# Patient Record
Sex: Female | Born: 2005 | Race: Black or African American | Hispanic: No | Marital: Single | Smoking: Never smoker
Health system: Southern US, Community
[De-identification: ages and names within clinical notes are randomized; demographics above are authoritative.]

## PROBLEM LIST (undated history)

## (undated) DIAGNOSIS — D1800 Hemangioma unspecified site: Secondary | ICD-10-CM

## (undated) DIAGNOSIS — L309 Dermatitis, unspecified: Secondary | ICD-10-CM

## (undated) DIAGNOSIS — F909 Attention-deficit hyperactivity disorder, unspecified type: Secondary | ICD-10-CM

## (undated) DIAGNOSIS — K589 Irritable bowel syndrome without diarrhea: Secondary | ICD-10-CM

## (undated) DIAGNOSIS — J302 Other seasonal allergic rhinitis: Secondary | ICD-10-CM

## (undated) HISTORY — DX: Hemangioma unspecified site: D18.00

## (undated) HISTORY — PX: TONSILLECTOMY: SUR1361

## (undated) HISTORY — PX: ADENOIDECTOMY: SHX5191

---
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
## 2024-02-01 ENCOUNTER — Emergency Department (HOSPITAL_COMMUNITY)
Admission: EM | Admit: 2024-02-01 | Discharge: 2024-02-01 | Disposition: A | Discharge status: Home / Self Care | Attending: Emergency Medicine | Admitting: Emergency Medicine

## 2024-02-01 ENCOUNTER — Encounter (HOSPITAL_COMMUNITY): Payer: Self-pay

## 2024-02-01 ENCOUNTER — Other Ambulatory Visit: Payer: Self-pay

## 2024-02-01 DIAGNOSIS — R109 Unspecified abdominal pain: Secondary | ICD-10-CM | POA: Diagnosis present

## 2024-02-01 DIAGNOSIS — R197 Diarrhea, unspecified: Secondary | ICD-10-CM | POA: Insufficient documentation

## 2024-02-01 DIAGNOSIS — R1084 Generalized abdominal pain: Secondary | ICD-10-CM | POA: Diagnosis not present

## 2024-02-01 LAB — LIPASE, BLOOD: Lipase: 32 U/L (ref 11–51)

## 2024-02-01 LAB — COMPREHENSIVE METABOLIC PANEL WITH GFR
ALT: 12 U/L (ref 0–44)
AST: 15 U/L (ref 15–41)
Albumin: 3.9 g/dL (ref 3.5–5.0)
Alkaline Phosphatase: 78 U/L (ref 38–126)
Anion gap: 12 (ref 5–15)
BUN: 10 mg/dL (ref 6–20)
CO2: 21 mmol/L — ABNORMAL LOW (ref 22–32)
Calcium: 9 mg/dL (ref 8.9–10.3)
Chloride: 106 mmol/L (ref 98–111)
Creatinine, Ser: 0.7 mg/dL (ref 0.44–1.00)
GFR, Estimated: >60 mL/min (ref >60)
Glucose, Bld: 99 mg/dL (ref 70–99)
Potassium: 3.7 mmol/L (ref 3.5–5.1)
Sodium: 139 mmol/L (ref 135–145)
Total Bilirubin: 0.6 mg/dL (ref 0.0–1.2)
Total Protein: 7.7 g/dL (ref 6.5–8.1)

## 2024-02-01 LAB — CBC
HCT: 41.8 % (ref 36.0–46.0)
Hemoglobin: 13.5 g/dL (ref 12.0–15.0)
MCH: 27.8 pg (ref 26.0–34.0)
MCHC: 32.3 g/dL (ref 30.0–36.0)
MCV: 86.2 fL (ref 80.0–100.0)
Platelets: 457 10*3/uL — ABNORMAL HIGH (ref 150–400)
RBC: 4.85 MIL/uL (ref 3.87–5.11)
RDW: 12.7 % (ref 11.5–15.5)
WBC: 4.9 10*3/uL (ref 4.0–10.5)
nRBC: 0 % (ref 0.0–0.2)

## 2024-02-01 LAB — HCG, SERUM, QUALITATIVE: Preg, Serum: NEGATIVE

## 2024-02-01 MED ORDER — ONDANSETRON 4 MG PO TBDP: 4.0000 mg | ORAL_TABLET | Freq: Three times a day (TID) | ORAL | 0 refills | Status: AC | PRN

## 2024-02-01 NOTE — ED Notes (Addendum)
 Pt got urine sample but dropped it in the sharps container. Pt informed she will have to another when she can

## 2024-02-01 NOTE — Discharge Instructions (Addendum)
 You were seen today for abdominal pain accompanied with diarrhea after eating Popeyes.  Suspect this might be either a viral gastroenteritis or a IBS flareup.  Would recommend that you continue to avoid fatty/spicy foods as well as eating more gentle fluids on the stomach like the brat diet (bread, rice, applesauce, toast), as well as taking probiotics to help with stomach discomfort.  Continue to take Tylenol  for abdominal discomfort as well as nausea medications which I am prescribing for you to take as needed.  Please return to the ED if you begin to have uncontrollable pain, fever, blood in stool or urine, shortness of breath, vomiting, as imaging will be needed at that time.  You have any persistent symptoms that continue, recommend that you follow-up with PCP for further evaluation at that time.

## 2024-02-01 NOTE — ED Triage Notes (Signed)
 LLQ pain that radiates into left side for 3 days, worse after eating. Nausea, no vomiting, diarrhea.

## 2024-02-01 NOTE — ED Provider Notes (Signed)
 [place] EMERGENCY DEPARTMENT AT [facility] Provider Note   CSN: [id number] Arrival date & time: 02/01/24  1944     Patient presents with: Abdominal Pain   Valerie Alvarez is a 18 y.o. female.   Abdominal Pain Associated symptoms: diarrhea and nausea   Patient is an 18 year old female presenting to the ED with her mother today for concerns for abdominal pain with watery diarrhea after eating a Popeyes spicy sandwich 2 days ago.  Says that since then she has had nausea as well as persistent diarrhea.  States that her abdominal pain has been coming in waves and hurts particularly around bowel movements.  LMP started 2 days ago and is currently on her period, normal flow.  Medical history of abdominal pain, IBS, dermatitis, hemangioma, ADHD  Mother states that she had previous symptoms 2 years ago that was worked up extensively with ultrasound, CT, MRIs with no acute findings.  Denies fever, headache, vision changes, chest pain, shortness of breath, vomiting, dysuria, hematuria, hematochezia, melena, vaginal discharge, vaginal itching, lower leg swelling.     Prior to Admission medications   Medication Sig Start Date End Date Taking? Authorizing Provider  acetaminophen  (TYLENOL ) 325 MG tablet Take 2 tablets (650 mg total) by mouth every 6 (six) hours as needed for mild pain. 01/20/21   Gold, Caitlyn, MD  acetaminophen  (TYLENOL ) 500 MG tablet Take 500 mg by mouth every 6 (six) hours as needed.    [provider]  adapalene (DIFFERIN) 0.1 % cream Apply 1 Application topically at bedtime. 06/06/20   [provider]  busPIRone (BUSPAR) 10 MG tablet Take 10 mg by mouth 2 (two) times daily. 05/13/23   [provider]  busPIRone (BUSPAR) 15 MG tablet Take 15 mg by mouth 2 (two) times daily. 09/10/23   [provider]  cetirizine  (ZYRTEC ) 10 MG tablet Take 10 mg by mouth daily. 11/13/20   [provider]  Clindamycin-Benzoyl Per,  Refr, gel Apply 1 Application topically 2 (two) times daily. 06/06/20   [provider]  cyclobenzaprine  (FLEXERIL ) 10 MG tablet Take 1 tablet (10 mg) approximately 4 hours before your scheduled appointment. Patient not taking: Reported on 06/15/2023 05/03/23   Joshua Bari HERO, NP  dexmethylphenidate (FOCALIN) 5 MG tablet Take 5 mg by mouth daily as needed (only during the school year). 11/13/20   [provider]  famotidine  (PEPCID ) 20 MG tablet Take 1 tablet (20 mg total) by mouth 2 (two) times daily. 01/20/21 02/19/21  Gold, Caitlyn, MD  fluocinonide (LIDEX) 0.05 % external solution Apply 1 Application topically as needed. 01/20/13   [provider]  fluticasone  (FLONASE ) 50 MCG/ACT nasal spray Place 2 sprays into both nostrils daily.    [provider]  ketoconazole (NIZORAL) 2 % shampoo Apply 1 Application topically daily at 2 PM. 05/30/14   [provider]  loratadine (CLARITIN) 5 MG/5ML syrup Take by mouth daily. 05/30/14   [provider]  Olopatadine  HCl (PATADAY  OP) Place 2 drops into both eyes daily.    [provider]  Olopatadine  HCl 0.6 % SOLN Place 2 each into the nose daily.    [provider]  ondansetron  (ZOFRAN ) 4 MG tablet Take 1 tablet (4 mg total) by mouth every 8 (eight) hours as needed for nausea or vomiting. 01/20/21   Gold, Caitlyn, MD  ondansetron  (ZOFRAN -ODT) 4 MG disintegrating tablet Take 1 tablet (4 mg total) by mouth every 8 (eight) hours as needed. 02/01/24   Zada Haser  S, PA-C  pantoprazole (PROTONIX) 40 MG tablet Take 40 mg by mouth daily. 01/27/21   [provider]  Phenylephrine-Pheniramine-DM [facility] COLD & COUGH PO) Take by mouth.    [provider]  polyethylene glycol (MIRALAX  / GLYCOLAX ) 17 g packet Take 17 g by mouth as needed (constipation).    [provider]  QUILLICHEW  ER 40 MG CHER chewable tablet Take 40 mg by mouth every morning. 08/26/18   [provider]  spironolactone  (ALDACTONE ) 50 MG tablet Take 1 tablet (50 mg total) by mouth daily. 06/15/23 09/13/23  Joshua Bari HERO, NP  topiramate  (TOPAMAX ) 25 MG tablet Take 1 tablet (25 mg total) by mouth 2 (two) times daily. 12/19/19   Corinthia Blossom, MD  Vitamin D , Ergocalciferol , (DRISDOL ) 1.25 MG (50000 UNIT) CAPS capsule Take 1 capsule (50,000 Units total) by mouth every 7 (seven) days. 05/05/23   Joshua Bari HERO, NP  amitriptyline  (ELAVIL ) 25 MG tablet Take 1.5 tablets (37.5 mg total) by mouth at bedtime. 08/14/19 01/17/21  Corinthia Blossom, MD    Allergies: Lactose intolerance (gi), Milk (cow), Wheat, and Other    Review of Systems  Gastrointestinal:  Positive for abdominal pain, diarrhea and nausea.  All other systems reviewed and are negative.   Updated Vital Signs BP 114/82 (BP Location: Right Arm)   Pulse 79   Temp 98.8 F (37.1 C) (Oral)   Resp 19   Ht 5' 4 (1.626 m)   Wt 74.4 kg   SpO2 98%   BMI 28.15 kg/m   Physical Exam Vitals and nursing note reviewed.  Constitutional:      General: She is not in acute distress.    Appearance: Normal appearance. She is not ill-appearing or diaphoretic.  HENT:     Head: Normocephalic and atraumatic.     Mouth/Throat:     Mouth: Mucous membranes are moist.     Pharynx: Oropharynx is clear. No pharyngeal swelling or oropharyngeal exudate.  Eyes:     General: No scleral icterus.    Extraocular Movements: Extraocular movements intact.     Conjunctiva/sclera: Conjunctivae normal.     Pupils: Pupils are equal, round, and reactive to light.  Cardiovascular:     Rate and Rhythm: Normal rate and regular rhythm.     Pulses: Normal pulses.     Heart sounds: Normal heart sounds. No murmur heard.    No friction rub. No gallop.  Pulmonary:     Effort: Pulmonary effort is normal. No respiratory distress.     Breath sounds: Normal breath sounds. No stridor. No wheezing, rhonchi or rales.  Abdominal:     General: Abdomen is flat. A  surgical scar is present. Bowel sounds are normal. There is no distension.     Palpations: Abdomen is soft. There is no hepatomegaly.     Tenderness: There is abdominal tenderness in the epigastric area and left lower quadrant. There is no right CVA tenderness, left CVA tenderness, guarding or rebound. Negative signs include Murphy's sign, Rovsing's sign, McBurney's sign, psoas sign and obturator sign.     Hernia: No hernia is present.     Comments: Hemangioma removal scar noted to the left side of her abdomen  Genitourinary:    Comments: Deferred GU exam at this time. Skin:    General: Skin is warm and dry.     Coloration: Skin is not mottled or pale.     Findings: No erythema.  Neurological:     General: No focal deficit present.  Mental Status: She is alert and oriented to person, place, and time. Mental status is at baseline.     Cranial Nerves: No cranial nerve deficit.     Motor: No weakness.  Psychiatric:        Mood and Affect: Mood normal. Mood is not anxious.     (all labs ordered are listed, but only abnormal results are displayed) Labs Reviewed  COMPREHENSIVE METABOLIC PANEL WITH GFR - Abnormal; Notable for the following components:      Result Value   CO2 21 (*)    All other components within normal limits  CBC - Abnormal; Notable for the following components:   Platelets 457 (*)    All other components within normal limits  LIPASE, BLOOD  HCG, SERUM, QUALITATIVE  URINALYSIS, ROUTINE W REFLEX MICROSCOPIC    EKG: None  Radiology: No results found.   Procedures   Medications Ordered in the ED - No data to display                                Medical Decision Making Risk Prescription drug management.   This patient is a 18 year old female with mother who presents to the ED for concern of epigastric pain abdominal pain and left lower quadrant abdominal pain that began suddenly after eating Popeyes 2 days ago.  Notes that she has a previous medical  history of IBS as well as had had similar symptoms 2 years ago that was on the right lower quadrant which was extensively worked up with MRIs, CTs and ultrasounds did not find any acute findings.  On physical exam, patient is in no acute distress, afebrile, alert and orient x 4, speaking in full sentences, nontachypneic, nontachycardic.  Noted to have mild abdominal tenderness to palpation to the epigastric and left lower quadrants.  No rebound tenderness, no acute abdomen findings.  No rashes.  Bowel sounds present in all 4 quadrants.  No CVA tenderness.  Patient deferred GU exam this time.  Oropharynx clear.  No lower leg edema, LCTAB, RRR, no murmur.  Unremarkable exam otherwise.  Lab findings were unremarkable.  Discussed with both patient and mother about possible risks as well as possible imaging for further evaluation of patient symptoms.  However spoke with patient and mother and had shared decision making, with patient requesting to go home and not undergo further imaging at this time.  She believes the pain is manageable and would like to go home and monitor symptoms, believing this is most likely an IBS flareup.  Spoke to her that imaging will be necessary to rule out all of the emergent pathologies regarding her GU versus GI conditions that could present with the symptoms however believe that she could go home and continue to monitor and return if anything worsens.  She also does not wish to provide any urine at this time and desires to go home.  Will send home with Zofran  for some nausea and have her return for any new or worsening symptoms.  Patient vital signs have remained stable throughout the course of patient's time in the ED. Low suspicion for any other emergent pathology at this time. I believe this patient is safe to be discharged. Provided strict return to ER precautions. Patient expressed agreement and understanding of plan. All questions were answered.   Differential diagnoses prior  to evaluation: The emergent differential diagnosis includes, but is not limited to, mesenteric ischemia, diverticulitis, nephrolithiasis,  constipation, bowel obstruction, IBD, ectopic pregnancy, ovarian torsion, PID. This is not an exhaustive differential.   Past Medical History / Co-morbidities / Social History: abdominal pain, IBS, dermatitis, hemangioma, ADHD  Additional history: Chart reviewed. Pertinent results include:   Noted to have CT abdomen pelvis, ultrasound of pelvis and right upper quadrant as well as MRI of abdomen pelvis and MR angiogram of the abdomen, showing no acute findings or specific findings.  Lab Tests/Imaging studies: I personally interpreted labs/imaging and the pertinent results include:    CBC unremarkable CMP unremarkable Lipase unremarkable hCG qualitative.   Considered CT abdomen pelvis with contrast as well as ultrasound of pelvis with Doppler however patient had shared decision-making and did not wish to undergo any imaging at this time.  Medications: I ordered medication including Zofran .  I have reviewed the patients home medicines and have made adjustments as needed.  Critical Interventions: None  Social Determinants of Health: Has good follow-up with PCP and is living at home with mother who is also able to help  Disposition: After consideration of the diagnostic results and the patients response to treatment, I feel that the patient would benefit from discharge and treatment as above.   emergency department workup does not suggest an emergent condition requiring admission or immediate intervention beyond what has been performed at this time. The plan is: Follow-up with PCP for any persistent symptoms, Zofran  for nausea, return to the ED for any new or worsening symptoms, continue monitor symptoms at home. The patient is safe for discharge and has been instructed to return immediately for worsening symptoms, change in symptoms or any other  concerns.    Final diagnoses:  Diarrhea, unspecified type  Generalized abdominal pain    ED Discharge Orders          Ordered    ondansetron  (ZOFRAN -ODT) 4 MG disintegrating tablet  Every 8 hours PRN        02/01/24 2251               Beola Terrall RAMAN, PA-C 02/01/24 2316    Albertina Dixon, MD 02/02/24 [phone]
